# Patient Record
Sex: Male | Born: 2005 | Race: White | Hispanic: No | Marital: Single | Smoking: Never smoker
Health system: Southern US, Community
[De-identification: ages and names within clinical notes are randomized; demographics above are authoritative.]

## PROBLEM LIST (undated history)

## (undated) DIAGNOSIS — F902 Attention-deficit hyperactivity disorder, combined type: Secondary | ICD-10-CM

## (undated) DIAGNOSIS — R278 Other lack of coordination: Secondary | ICD-10-CM

## (undated) HISTORY — DX: Attention-deficit hyperactivity disorder, combined type: F90.2

## (undated) HISTORY — DX: Other lack of coordination: R27.8

## (undated) HISTORY — PX: TYMPANOSTOMY TUBE PLACEMENT: SHX32

## (undated) HISTORY — PX: CIRCUMCISION: SUR203

---
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
## 2023-02-20 ENCOUNTER — Ambulatory Visit: Payer: BC Managed Care – PPO | Admitting: Podiatry

## 2023-02-27 ENCOUNTER — Ambulatory Visit: Payer: BC Managed Care – PPO | Admitting: Podiatry

## 2023-04-10 DIAGNOSIS — J101 Influenza due to other identified influenza virus with other respiratory manifestations: Secondary | ICD-10-CM | POA: Diagnosis not present

## 2023-05-30 DIAGNOSIS — L600 Ingrowing nail: Secondary | ICD-10-CM | POA: Diagnosis not present

## 2023-10-28 DIAGNOSIS — D2261 Melanocytic nevi of right upper limb, including shoulder: Secondary | ICD-10-CM | POA: Diagnosis not present

## 2023-10-28 DIAGNOSIS — D2361 Other benign neoplasm of skin of right upper limb, including shoulder: Secondary | ICD-10-CM | POA: Diagnosis not present

## 2024-03-02 DIAGNOSIS — D225 Melanocytic nevi of trunk: Secondary | ICD-10-CM | POA: Diagnosis not present

## 2024-03-02 DIAGNOSIS — R233 Spontaneous ecchymoses: Secondary | ICD-10-CM | POA: Diagnosis not present

## 2024-03-02 DIAGNOSIS — L910 Hypertrophic scar: Secondary | ICD-10-CM | POA: Diagnosis not present

## 2024-04-01 ENCOUNTER — Ambulatory Visit: Payer: Self-pay | Admitting: Podiatry

## 2024-04-01 ENCOUNTER — Encounter: Payer: Self-pay | Admitting: Podiatry

## 2024-04-01 DIAGNOSIS — L600 Ingrowing nail: Secondary | ICD-10-CM

## 2024-04-01 NOTE — Progress Notes (Signed)
 Subjective:   Patient ID: Brandon Osborne, male   DOB: 18 y.o.   MRN: [id number]   HPI Patient presents stating that he has a lot of pain in his left big toe lateral border he had his right 1 fixed 5 years ago.  States has tried to trim and soak it without relief of symptoms.  Patient is with mom today   Review of Systems  All other systems reviewed and are negative.       Objective:  Physical Exam Vitals and nursing note reviewed.  Constitutional:      Appearance: He is well-developed.  Pulmonary:     Effort: Pulmonary effort is normal.  Musculoskeletal:        General: Normal range of motion.  Skin:    General: Skin is warm.  Neurological:     Mental Status: He is alert.     Neurovascular status intact muscle strength adequate range of motion within normal limits.  Patient is found to have incurvation lateral border left big toe painful when pressed inability to wear shoe gear comfortably slight redness no active drainage noted.  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

## 2024-04-01 NOTE — Patient Instructions (Signed)

## 2024-04-02 ENCOUNTER — Telehealth: Payer: Self-pay | Admitting: Podiatry

## 2024-04-02 NOTE — Telephone Encounter (Signed)
 Patient's mom informed to soak for 2 weeks, use a bandaid and triple antibiotic ointment. Informed mom to call back if needed.

## 2024-04-02 NOTE — Telephone Encounter (Signed)
 Patient's mom called to speak with nurse, regarding how to soak patient's foot, ingrown toe nail removal took place on 04/01/24 with Dr. GEANNIE Ovens at 4:15, Mom's number, [phone]

## 2024-04-13 ENCOUNTER — Ambulatory Visit

## 2024-04-13 DIAGNOSIS — L600 Ingrowing nail: Secondary | ICD-10-CM

## 2024-04-13 NOTE — Progress Notes (Signed)
 Subjective: Brandon Osborne is a 18 y.o.  male returns to office today for follow up evaluation after having a left lateral nail border of his left hallux nail avulsion performed. Patient has been soaking using epsom salt soaks and applying antibiotic covered with bandaid daily. Patient denies fevers, chills, nausea, vomiting. Denies any calf pain, chest pain, SOB.   Objective:  Vitals: Reviewed  General: Well developed, nourished, in no acute distress, alert and oriented x3   Dermatology: Skin is warm, dry and supple bilateral. Left lateral hallux nail border appears to be clean, dry, with mild granular tissue and surrounding scab. There is no surrounding erythema, edema, drainage/purulence. The remaining nails appear unremarkable at this time. There are no other lesions or other signs of infection present.  Neurovascular status: Intact. No lower extremity swelling; No pain with calf compression bilateral.  Musculoskeletal: Decreased tenderness to palpation of the left lateral hallux nail fold(s). Muscular strength within normal limits bilateral.   Assesement and Plan: S/p partial nail avulsion, doing well.   -Continue soaking in epsom salts twice a day followed by antibiotic ointment and a band-aid. Can leave uncovered at night. Continue this until completely healed.  -If the area has not healed in 2 weeks, call the office for follow-up appointment, or sooner if any problems arise.  -Monitor for any signs/symptoms of infection. Call the office immediately if any occur or go directly to the emergency room. Call with any questions/concerns.  Prentice Ovens, DPM
